# Patient Record
Sex: Female | Born: 1995 | Race: Black or African American | Hispanic: No | Marital: Single | Smoking: Never smoker
Health system: Southern US, Community
[De-identification: ages and names within clinical notes are randomized; demographics above are authoritative.]

---
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
## 2016-07-17 IMAGING — CR DG CHEST 2V
2 series · 2 of 2 positions shown · non-contrast
Comparison: None.

CLINICAL DATA: Chest pain since earlier today, nonsmoker

EXAM:
CHEST  2 VIEW

[chest pa]
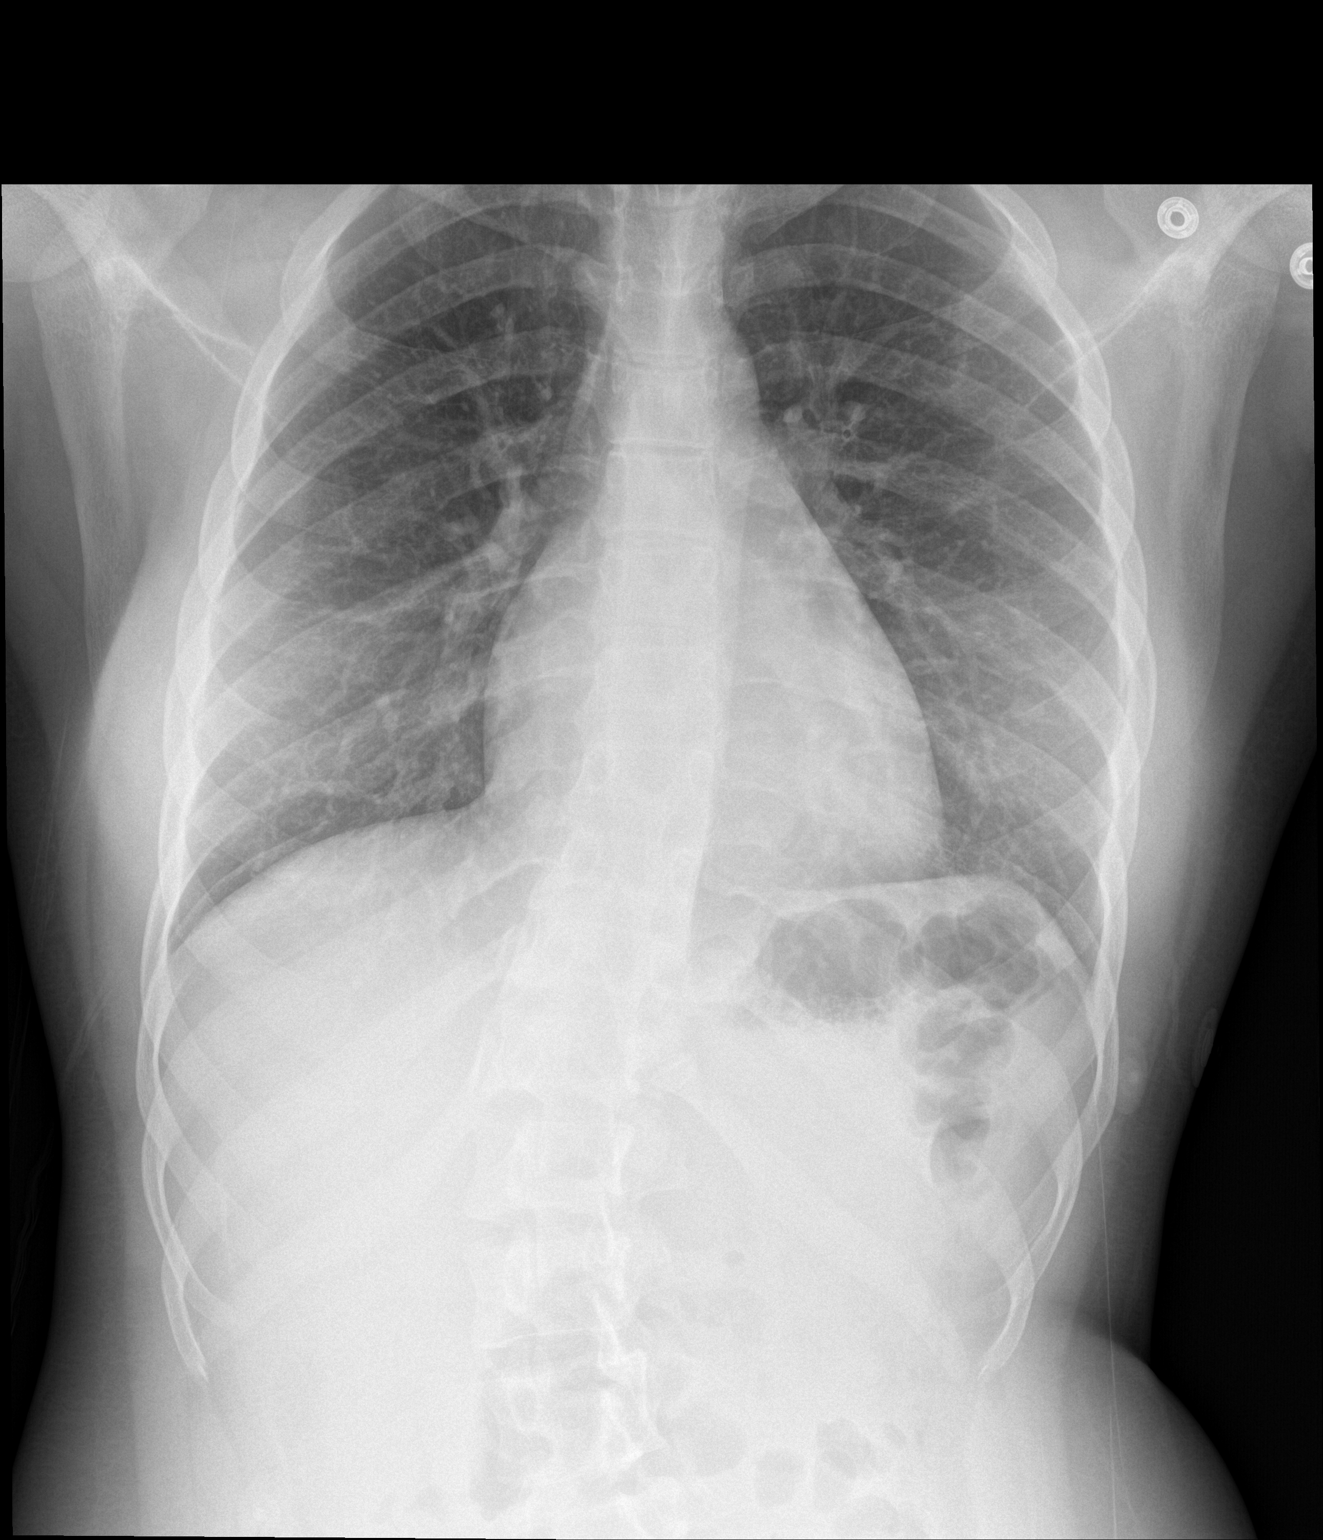

[chest lat]
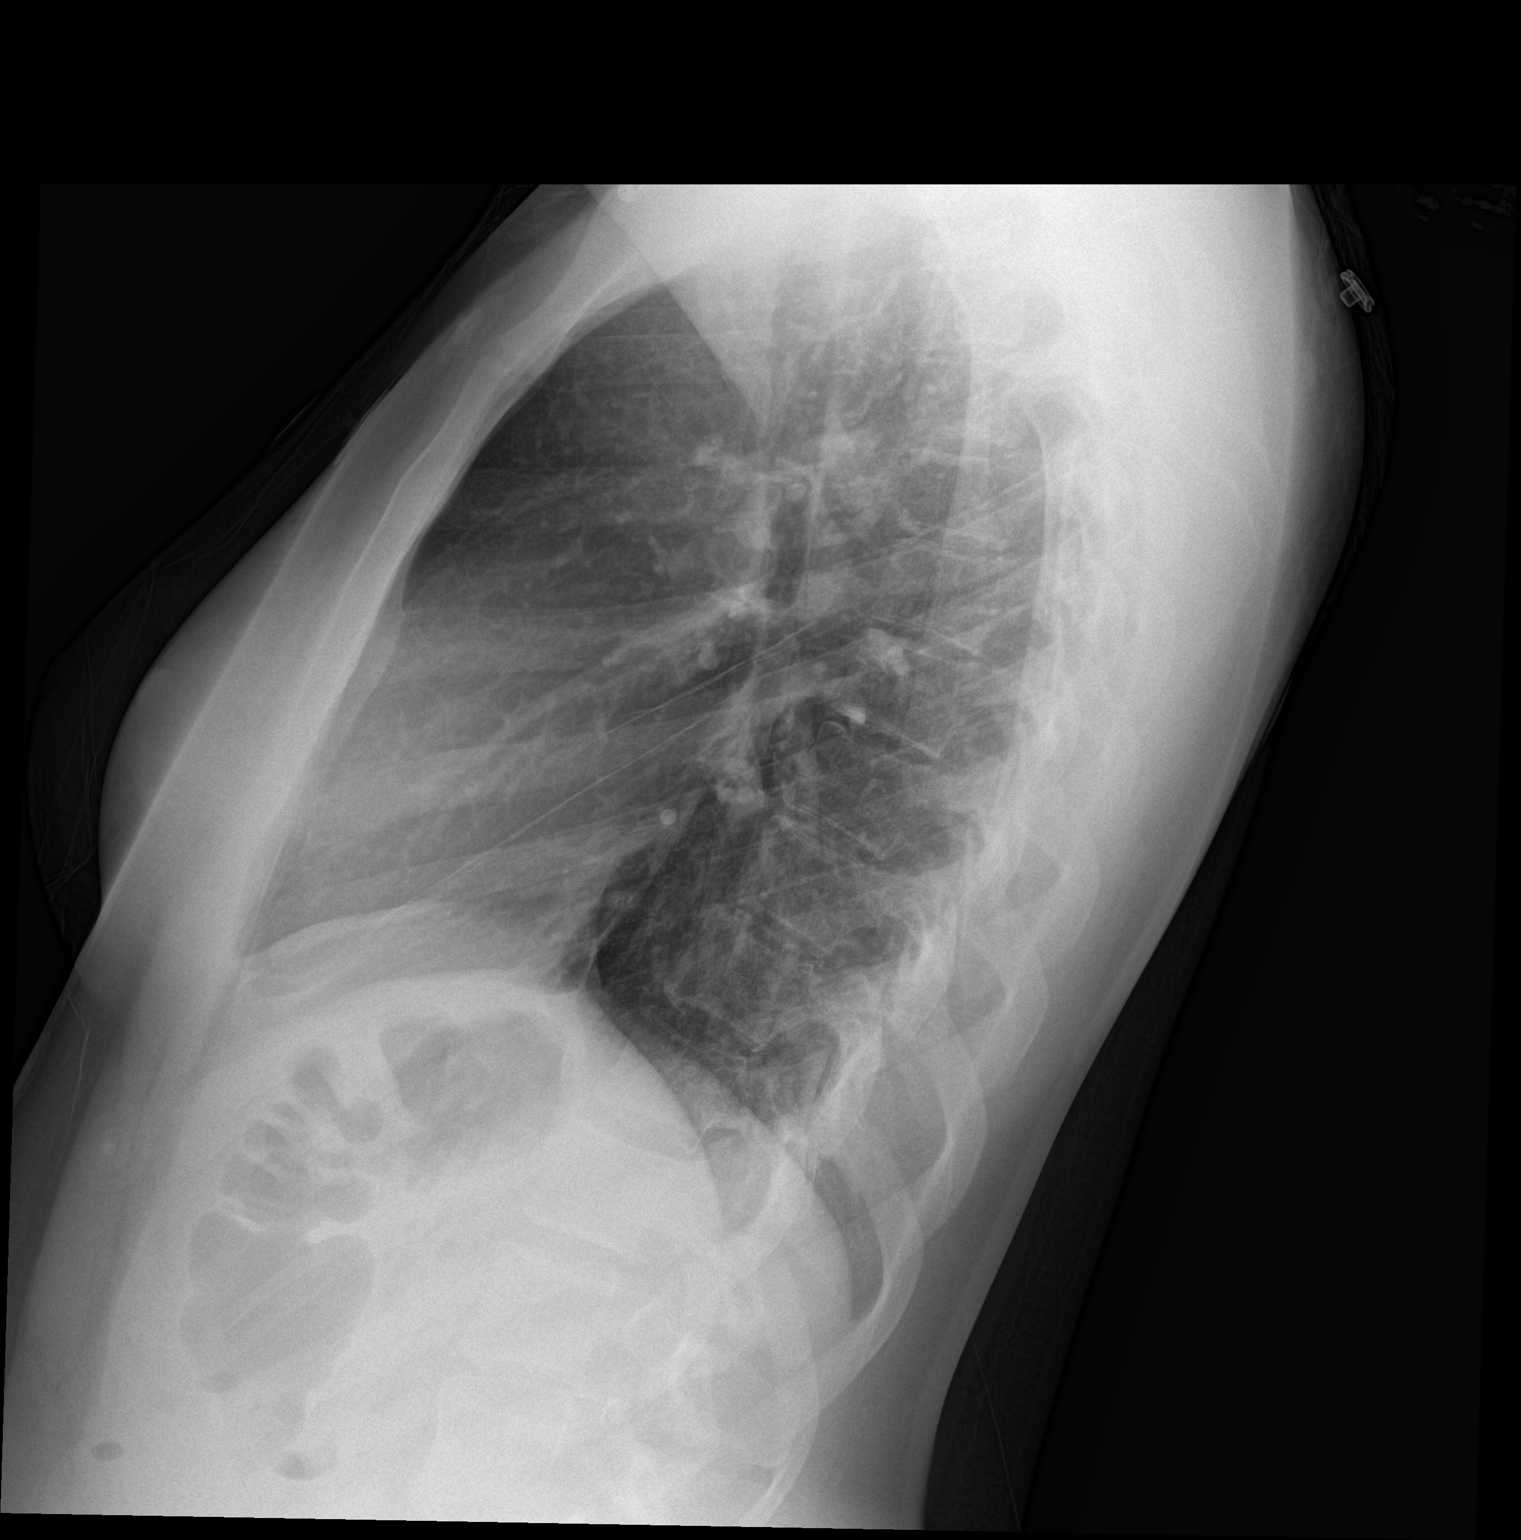

[2 of 2 positions shown; findings below may reference images not displayed]

FINDINGS: The lungs are adequately inflated. There is no focal infiltrate. The
interstitial markings are coarse. The heart and pulmonary
vascularity are normal. The mediastinum is normal in width. There is
no pleural effusion or pneumothorax. The bony thorax exhibits no
acute abnormality. There is moderate curvature convex toward the
right centered at the thoracolumbar junction.
IMPRESSION: Probable acute bronchitic changes. There is no pneumonia, CHF, nor
other acute cardiopulmonary abnormality.
